# Patient Record
Sex: Male | Born: 1990 | Race: Black or African American | Hispanic: No | Marital: Single | Smoking: Never smoker
Health system: Southern US, Community
[De-identification: ages and names within clinical notes are randomized; demographics above are authoritative.]

## PROBLEM LIST (undated history)

## (undated) HISTORY — PX: HERNIA REPAIR: SHX51

---

## 2011-12-24 ENCOUNTER — Encounter (HOSPITAL_COMMUNITY): Payer: Self-pay | Admitting: *Deleted

## 2011-12-24 ENCOUNTER — Emergency Department (HOSPITAL_COMMUNITY)
Admission: EM | Admit: 2011-12-24 | Discharge: 2011-12-24 | Disposition: A | Discharge status: Home Health | Payer: Self-pay | Source: Home / Self Care | Attending: Emergency Medicine | Admitting: Emergency Medicine

## 2011-12-24 DIAGNOSIS — B86 Scabies: Secondary | ICD-10-CM

## 2011-12-24 MED ORDER — PERMETHRIN 5 % EX CREA: TOPICAL_CREAM | CUTANEOUS | Status: AC

## 2011-12-24 NOTE — ED Provider Notes (Signed)
Chief Complaint  Patient presents with  . Rash    History of Present Illness:   Patient has had a one-week history of itchy bumps that began on his hands, between his fingers, spread up his arms and to his chest. A roommate may have had a similar rash. The bumps itch most at nighttime.  Review of Systems:  Other than noted above, the patient denies any of the following symptoms: Systemic:  No fever, chills, sweats, weight loss, or fatigue. ENT:  No nasal congestion, rhinorrhea, sore throat, swelling of lips, tongue or throat. Resp:  No cough, wheezing, or shortness of breath. Skin:  No rash, itching, nodules, or suspicious lesions.  PMFSH:  Past medical history, family history, social history, meds, and allergies were reviewed.  Physical Exam:   Vital signs:  BP 134/96  Pulse 66  Temp(Src) 98.5 F (36.9 C) (Oral)  Resp 16  SpO2 100% Gen:  Alert, oriented, in no distress. Skin:  He has scattered papules on his hands, particularly the webspaces between his fingers and on the sides of his fingers, his wrists, forearms, upper arms, and chest. Skin was otherwise clear.  Assessment:   Diagnoses that have been ruled out:  None  Diagnoses that are still under consideration:  None  Final diagnoses:  Scabies    Plan:   1.  The following meds were prescribed:   New Prescriptions   PERMETHRIN (ELIMITE) 5 % CREAM    Apply head to toe at bedtime, including skin fold areas, hands, feet and between fingers and toes.  Leave on for at least 8 hours.  Scrub off next morning.  Repeat this same procedure in 1 week.   2.  The patient was instructed in symptomatic care and handouts were given. 3.  The patient was told to return if becoming worse in any way, if no better in 3 or 4 days, and given some red flag symptoms that would indicate earlier return.    Roque Lias, MD 12/24/11 2138

## 2011-12-24 NOTE — ED Notes (Signed)
Pt is here with complaints of generalized, itching rash and "upset stomach" this am.  Pt thinks he might be having a reaction to a soap or lotion.

## 2011-12-24 NOTE — Discharge Instructions (Signed)
Scabies is a rash caused by infestation with the mite Sarcoptes scabiei, a 0.3 mm mite that can burrow and deposit eggs in the the surface layer of the skin.  It is transmitted from other infected persons and often the entire family is infested even though they may not have symptoms.  The most common symptom is a very itchy rash.  Scabies can be treated by applying a cream with a medication call Permethrin.  An oral medication called Ivermectin is also available. ° °Here are the the instructions for treatment of scabies.  It is important to remember that all household members should be treated twice.  Once now, and once in 2 weeks. ° °· On the first night, shower, then apply the Elimite cream from head to toe.  This means on the scalp, face, armpits, hands and wrists, under the breasts, the naval, the genital area, the cleft between the buttocks, the feet and between the toes--everywhere on the body.  Do not miss even 1 square inch. °· Leave the cream on overnight, at least 8 hours. °· The next morning, shower again and scrub the Elimite off completely.  Clip the nails short and scrub under the nails with a toothbrush, since the mites can often live under the nails, then be transmitted to other parts of the body.  °· After that, strip the bed and wash sheets, pillow cases, pajamas, underwear and anything that has come in contact with your body in the past month in hot water.   °· Spray your matresses, chairs, couch, and furniture with RID spray which can be gotten over the counter at the drug store. °· Repeat this entire process in 1 week. ° °After the 2 applications, all the mites on your body should be dead.  It will take a while for the itching to go away, sometimes as much as a month.  The skin must rid itself of all the dead mites, eggs, and excreta.  You can use antihistamines such as Benadryl until this happens.  If the itching is severe, cortisone derivatives may help. ° °If the itching persists, it may be  that the rash is caused by something else, that your were reinfected or the Permethrin did not work in which case it would be reasonable to try the oral pill Ivermectin.  If you still have a rash or itching in 1 month, return to the Urgent Care Center or your primary care doctor for a recheck. ° °

## 2016-04-09 ENCOUNTER — Emergency Department (HOSPITAL_COMMUNITY): Payer: Managed Care, Other (non HMO)

## 2016-04-09 ENCOUNTER — Emergency Department (HOSPITAL_COMMUNITY)
Admission: EM | Admit: 2016-04-09 | Discharge: 2016-04-09 | Disposition: A | Discharge status: Home / Self Care | Payer: Managed Care, Other (non HMO) | Attending: Emergency Medicine | Admitting: Emergency Medicine

## 2016-04-09 ENCOUNTER — Encounter (HOSPITAL_COMMUNITY): Payer: Self-pay | Admitting: *Deleted

## 2016-04-09 DIAGNOSIS — Y999 Unspecified external cause status: Secondary | ICD-10-CM | POA: Diagnosis not present

## 2016-04-09 DIAGNOSIS — S4992XA Unspecified injury of left shoulder and upper arm, initial encounter: Secondary | ICD-10-CM | POA: Diagnosis present

## 2016-04-09 DIAGNOSIS — X509XXA Other and unspecified overexertion or strenuous movements or postures, initial encounter: Secondary | ICD-10-CM | POA: Diagnosis not present

## 2016-04-09 DIAGNOSIS — Y939 Activity, unspecified: Secondary | ICD-10-CM | POA: Insufficient documentation

## 2016-04-09 DIAGNOSIS — Y929 Unspecified place or not applicable: Secondary | ICD-10-CM | POA: Insufficient documentation

## 2016-04-09 DIAGNOSIS — S43012A Anterior subluxation of left humerus, initial encounter: Secondary | ICD-10-CM | POA: Diagnosis not present

## 2016-04-09 DIAGNOSIS — S43005A Unspecified dislocation of left shoulder joint, initial encounter: Secondary | ICD-10-CM

## 2016-04-09 MED ORDER — HYDROMORPHONE HCL 1 MG/ML IJ SOLN: 0.5000 mg | Freq: Once | INTRAMUSCULAR | Status: DC

## 2016-04-09 MED ORDER — ETOMIDATE 2 MG/ML IV SOLN
10.0000 mg | Freq: Once | INTRAVENOUS | Status: AC
  Administered 2016-04-09: 10 mg via INTRAVENOUS
  Filled 2016-04-09: qty 10

## 2016-04-09 MED ORDER — OXYCODONE-ACETAMINOPHEN 5-325 MG PO TABS
1.0000 | ORAL_TABLET | ORAL | Status: DC | PRN
  Administered 2016-04-09: 1 via ORAL
  Filled 2016-04-09: qty 1

## 2016-04-09 MED ORDER — IBUPROFEN 800 MG PO TABS: 800.0000 mg | ORAL_TABLET | Freq: Three times a day (TID) | ORAL | Status: AC

## 2016-04-09 NOTE — Discharge Instructions (Signed)
Shoulder Dislocation °Your shoulder joint is made up of 3 bones: °· The upper arm bone (humerus). °· The shoulder blade (scapula). °· The collarbone (clavicle). °A shoulder dislocation happens when your upper arm bone moves out of its normal place in your shoulder joint. °HOME CARE °If You Have a Splint or Sling: °· Wear it as told by your doctor. °· Take it off only as told by your doctor. °· Loosen it if: °¨ Your fingers become numb and tingly. °¨ Your fingers turn cold and blue. °· Keep it clean and dry. °Bathing °· Do not take baths, swim, or use a hot tub until your doctor says you can. Ask your doctor if you can take showers. You may only be allowed to take sponge baths. °· If your doctor says taking baths or showers is okay, cover your splint or sling with a plastic bag. Do not let the splint or sling get wet. °Managing Pain, Stiffness, and Swelling °· If told, put ice on the injured area. °¨ Put ice in a plastic bag. °¨ Place a towel between your skin and the bag. °¨ Leave the ice on for 20 minutes, 2-3 times per day. °· Move your fingers often to avoid stiffness and to lessen swelling. °· Raise (elevate) the injured area above the level of your heart while you are sitting or lying down. °Driving °· Do not drive while you are wearing a splint or sling on a hand that you use for driving. °· Do not drive or operate heavy machinery while taking pain medicine. °Activity °· Return to your normal activities as told by your doctor. Ask your doctor what activities are safe for you. °· Do range-of-motion exercises only as told by your doctor. °· Exercise your hand by squeezing a soft ball. This keeps your hand and wrist from getting stiff and swollen. °General Instructions °· Take over-the-counter and prescription medicines only as told by your doctor. °· Do not use any tobacco products, including cigarettes, chewing tobacco, or e-cigarettes. Tobacco can slow down healing. If you need help quitting, ask your  doctor. °· Keep all follow-up visits as told by your doctor. This is important. °GET HELP IF: °· Your splint or sling gets damaged. °GET HELP RIGHT AWAY IF: °· Your pain gets worse instead of better. °· You lose feeling in your arm or hand. °· Your arm or hand turns white and cold. °  °This information is not intended to replace advice given to you by your health care provider. Make sure you discuss any questions you have with your health care provider. °  °Document Released: 01/10/2012 Document Revised: 07/09/2015 Document Reviewed: 02/10/2015 °Elsevier Interactive Patient Education ©2016 [employer] ° °

## 2016-04-09 NOTE — Progress Notes (Signed)
Patient listed as having [employer] insurance without a pcp.  EDCM spoke to patient at bedside.  Patient confirms he does not have a pcp but is in the process of [place].

## 2016-04-09 NOTE — ED Notes (Signed)
Pt complains of pain to his left shoulder since 6PM after hyperextending his left arm. Pt states he has dislocation in the same shoulder before.

## 2016-04-09 NOTE — ED Provider Notes (Signed)
CSN: [id number]     Arrival date & time 04/09/16  1910 History   First MD Initiated Contact with Patient 04/09/16 2032     Chief Complaint  Patient presents with  . Shoulder Injury     (Consider location/radiation/quality/duration/timing/severity/associated sxs/prior Treatment) HPI Comments: Patient with a history of left shoulder dislocation presents with pain in left shoulder after hyperextending (without fall) around 5:00. No other injury. No neck, chest or back pain. No numbness or weakness.   Patient is a 25 y.o. male presenting with shoulder injury. The history is provided by the patient. No language interpreter was used.  Shoulder Injury This is a recurrent problem. The current episode started today. The problem occurs constantly. The problem has been unchanged. Pertinent negatives include no abdominal pain, chest pain, numbness or weakness.    History reviewed. No pertinent past medical history. Past Surgical History  Procedure Laterality Date  . Hernia repair     No family history on file. Social History  Substance Use Topics  . Smoking status: Never Smoker   . Smokeless tobacco: None  . Alcohol Use: No    Review of Systems  Cardiovascular: Negative.  Negative for chest pain.  Gastrointestinal: Negative.  Negative for abdominal pain.  Musculoskeletal:       See HPI  Skin: Negative.  Negative for color change and wound.  Neurological: Negative.  Negative for weakness and numbness.      Allergies  Review of patient's allergies indicates no known allergies.  Home Medications   Prior to Admission medications   Not on File   BP 122/75 mmHg  Pulse 67  Temp(Src) 98.6 F (37 C) (Oral)  Resp 18  SpO2 100% Physical Exam  Constitutional: He is oriented to person, place, and time. He appears well-developed and well-nourished.  Neck: Normal range of motion.  Pulmonary/Chest: Effort normal.  Musculoskeletal: Normal range of motion.  Left shoulder has AC dropoff.  No significant swelling or discoloration. 5/5 grip strength of left hand with painless pronation/supination of wrist. No midline cervical tenderness.   Neurological: He is alert and oriented to person, place, and time.  Skin: Skin is warm and dry.  Psychiatric: He has a normal mood and affect.    ED Course  Reduction of dislocation Date/Time: 04/10/2016 5:09 AM Performed by: Elpidio Anis Authorized by: Elpidio Anis Consent: Verbal consent obtained. Risks and benefits: risks, benefits and alternatives were discussed Consent given by: patient Patient understanding: patient states understanding of the procedure being performed Imaging studies: imaging studies available Patient identity confirmed: verbally with patient and arm band Patient sedated: yes Sedatives: etomidate Vitals: Vital signs were monitored during sedation. Patient tolerance: Patient tolerated the procedure well with no immediate complications Comments: Left anterior shoulder dislocation successfully and easily reduced by traction and external rotation of the left arm.    (including critical care time) Labs Review Labs Reviewed - No data to display  Imaging Review Dg Shoulder Left  04/09/2016  CLINICAL DATA:  Left shoulder injury on trampoline today EXAM: LEFT SHOULDER - 2+ VIEW COMPARISON:  None. FINDINGS: Humeral head is in an abnormally anterior and inferior position relative to the glenoid. It appears completely dislocated on the Y-view. No fracture is identified. IMPRESSION: Complete anterior dislocation of the humerus. Electronically Signed   By: Esperanza Heir M.D.   On: 04/09/2016 20:05   I have personally reviewed and evaluated these images and lab results as part of my medical decision-making.   EKG Interpretation None  MDM   Final diagnoses:  None    1. Left shoulder dislocation.  Patient presents with left shoulder injury and is found to have an anterior dislocation. Conscious sedation  performed by Dr. Jacalyn LefevreJulie Haviland. Post reduction imaging shows appropriate joint reduction. Immobilizer applied. He is returning home in 2 days and will follow with orthopedist when he returns home.   Elpidio AnisShari Darlys Buis, PA-C 04/10/16 [phone]  Jacalyn LefevreJulie Haviland, MD 04/10/16 [phone]

## 2016-11-28 IMAGING — DX DG SHOULDER 2+V*L*
2 series · 2 of 2 positions shown · non-contrast
Comparison: Prior left shoulder same day

CLINICAL DATA: Postreduction

EXAM:
LEFT SHOULDER - 2+ VIEW

[shoulder ap]
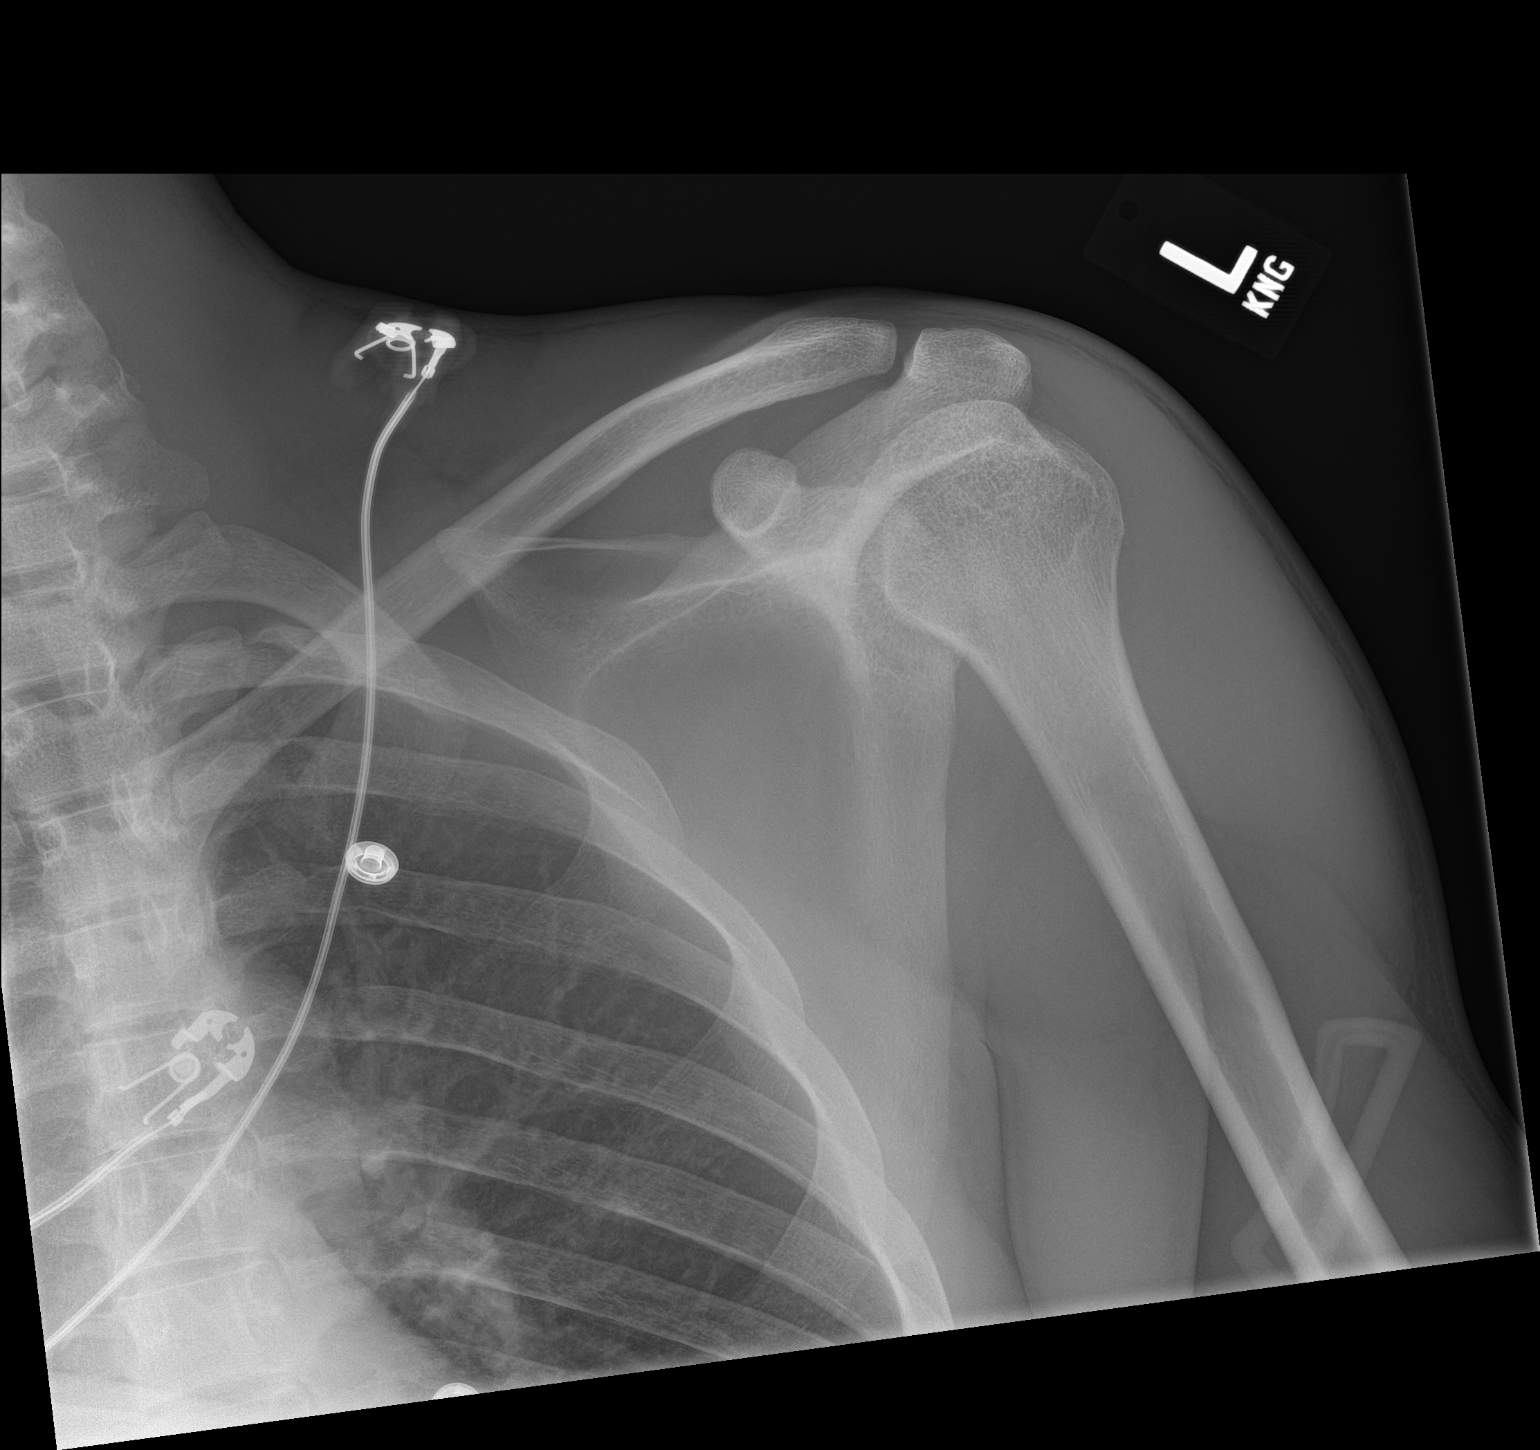

[shoulder y-view]
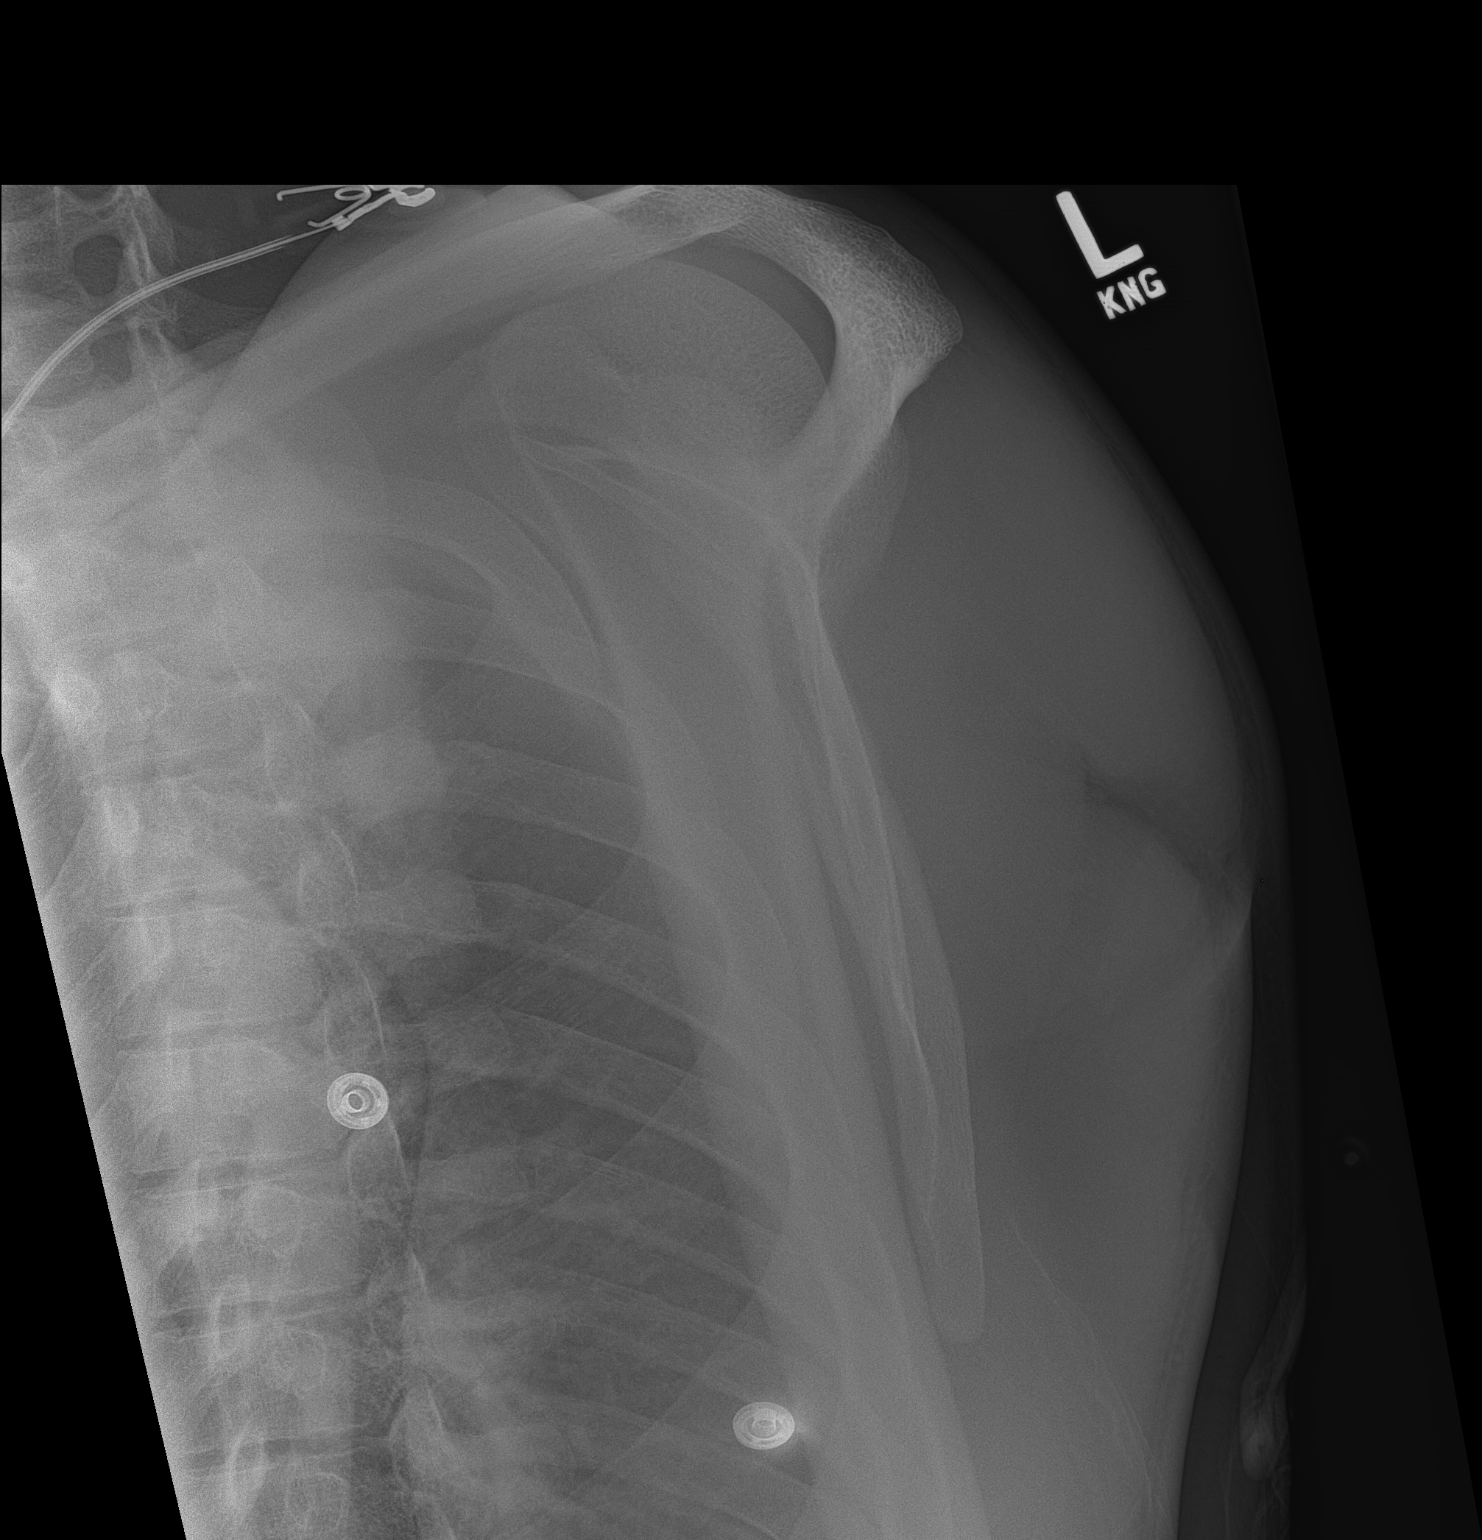

[2 of 2 positions shown; findings below may reference images not displayed]

FINDINGS: Postreduction left humeral head in anatomic alignment with left
glenoid.
IMPRESSION: Postreduction  left shoulder with anatomic alignment.
